# Patient Record
Sex: Female | Born: 1975 | Race: White | Hispanic: No | Marital: Married | Smoking: Never smoker
Health system: Southern US, Community
[De-identification: ages and names within clinical notes are randomized; demographics above are authoritative.]

## PROBLEM LIST (undated history)

## (undated) HISTORY — PX: NASAL CONCHA BULLOSA RESECTION: SHX2062

---

## 2010-07-23 ENCOUNTER — Ambulatory Visit (INDEPENDENT_AMBULATORY_CARE_PROVIDER_SITE_OTHER): Payer: BC Managed Care – PPO

## 2010-07-23 ENCOUNTER — Inpatient Hospital Stay (INDEPENDENT_AMBULATORY_CARE_PROVIDER_SITE_OTHER)
Admission: RE | Admit: 2010-07-23 | Discharge: 2010-07-23 | Disposition: A | Discharge status: Home / Self Care | Payer: BC Managed Care – PPO | Source: Ambulatory Visit | Attending: Family Medicine | Admitting: Family Medicine

## 2010-07-23 DIAGNOSIS — IMO0002 Reserved for concepts with insufficient information to code with codable children: Secondary | ICD-10-CM

## 2010-07-23 DIAGNOSIS — S92919A Unspecified fracture of unspecified toe(s), initial encounter for closed fracture: Secondary | ICD-10-CM

## 2011-09-23 IMAGING — CR DG TOE 5TH 2+V*L*
1 series · 1 of 1 positions shown · non-contrast
Comparison: None.

CLINICAL DATA: Pain. Stubbed fifth toe on brick wall running.
Obvious deformity.

LEFT TOE - 2+ VIEW

[view not recorded]
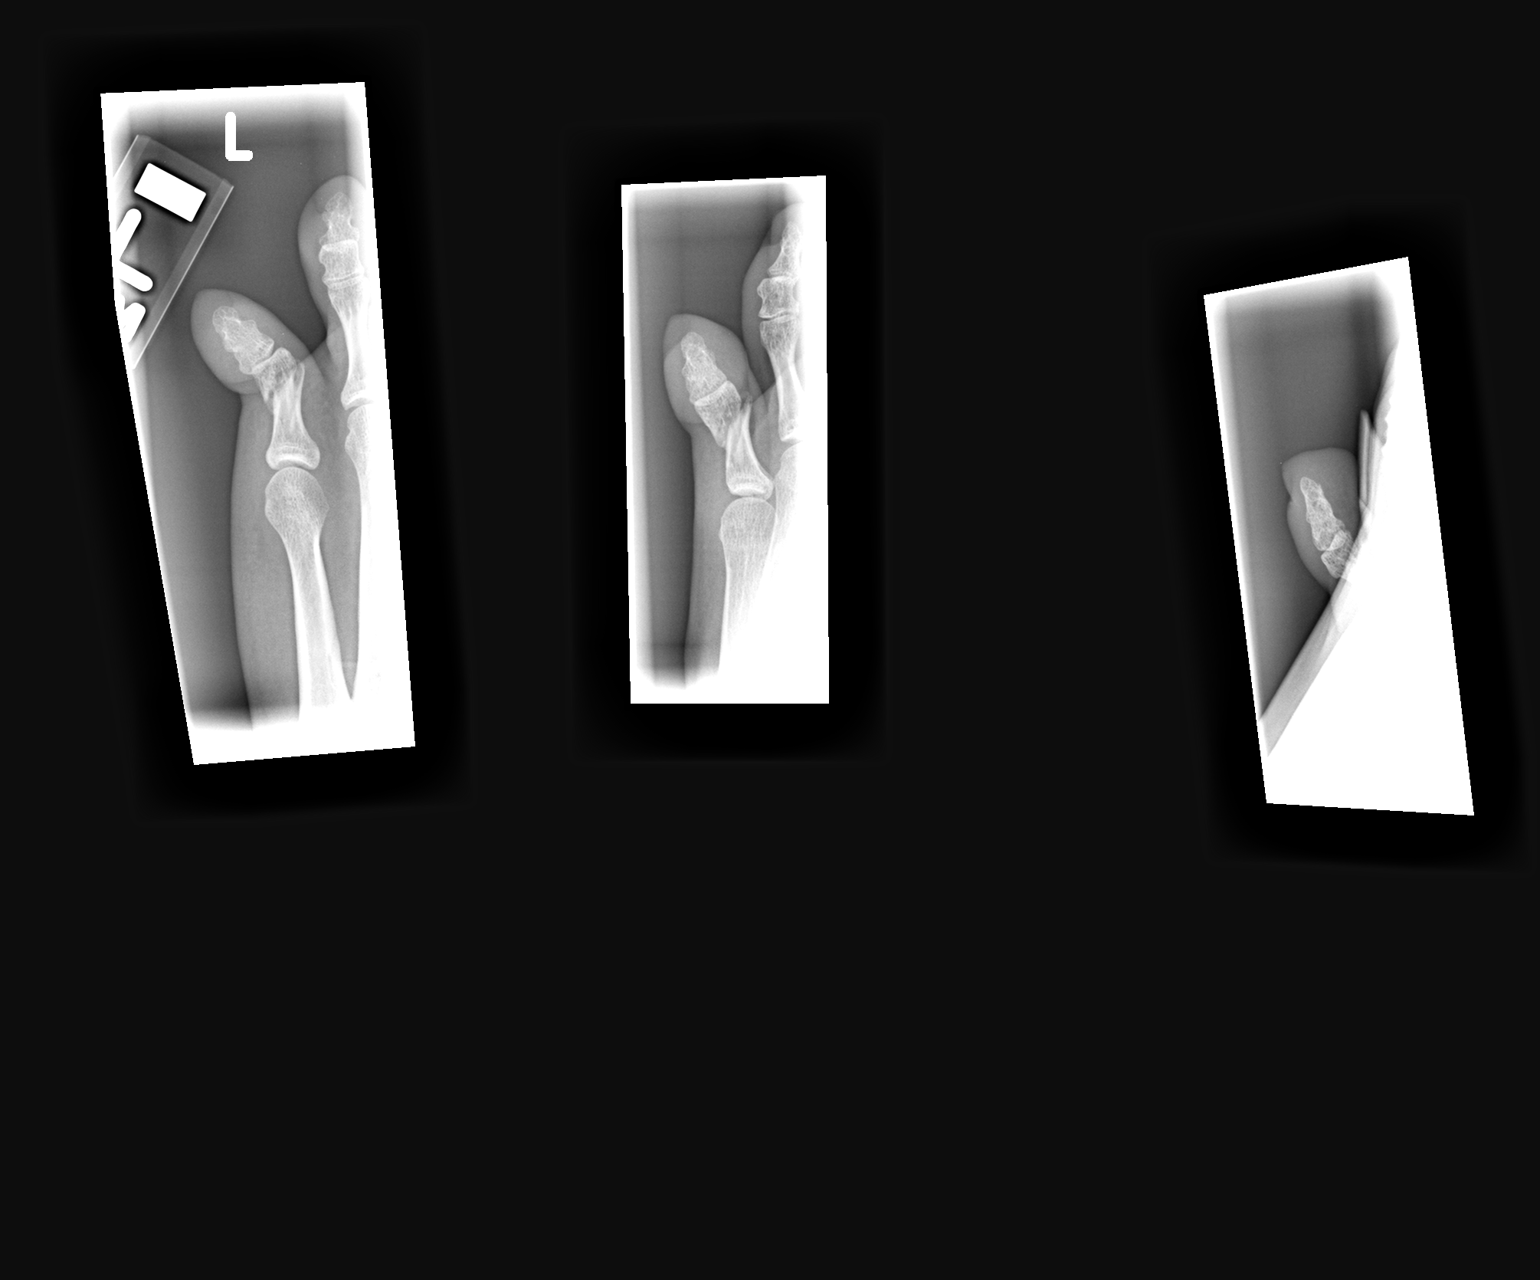

[1 of 1 positions shown; findings below may reference images not displayed]

FINDINGS: An oblique fracture is present within the proximal
phalanx of the fifth toe.  No fractures displaced at least 2.5 mm.
The joints are intact.  No radiopaque foreign body is evident.
IMPRESSION: Oblique displaced fracture of the proximal phalanx in the fifth
digit.

## 2011-09-23 IMAGING — CR DG TOE 5TH 2+V*L*
1 series · 1 of 1 positions shown · non-contrast
Comparison: Plain films of the left fifth toe [DATE]/[id number]

CLINICAL DATA: Post reduction film.

LEFT TOE - 2+ VIEW

[view not recorded]
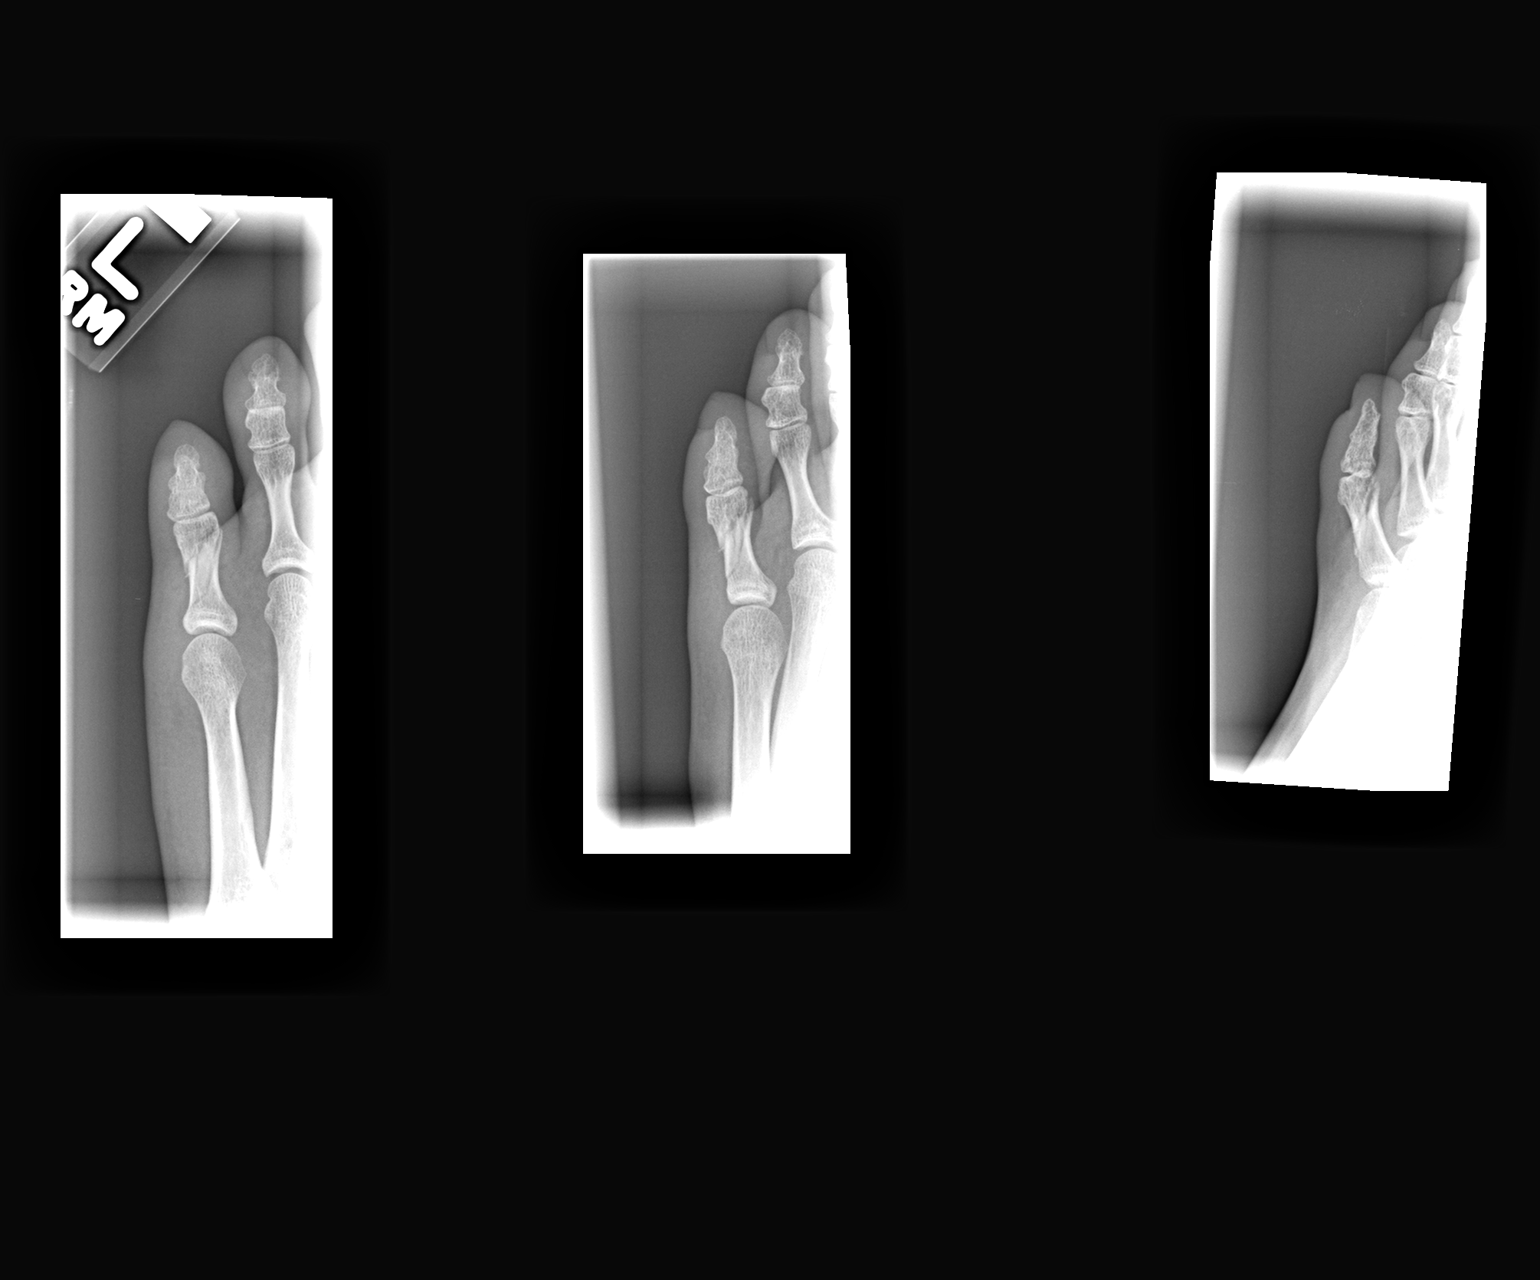

[1 of 1 positions shown; findings below may reference images not displayed]

FINDINGS: Position and alignment of the fracture of the proximal
phalanx of the little toe is now anatomic.  No new abnormality.
IMPRESSION: As above.

## 2013-07-23 ENCOUNTER — Ambulatory Visit (INDEPENDENT_AMBULATORY_CARE_PROVIDER_SITE_OTHER): Payer: BC Managed Care – PPO | Admitting: Surgery

## 2013-07-23 ENCOUNTER — Encounter (INDEPENDENT_AMBULATORY_CARE_PROVIDER_SITE_OTHER): Payer: Self-pay | Admitting: Surgery

## 2013-07-23 VITALS — BP 116/74 | HR 76 | Temp 98.2°F | Resp 16 | Ht 64.0 in | Wt 143.0 lb

## 2013-07-23 DIAGNOSIS — L723 Sebaceous cyst: Secondary | ICD-10-CM

## 2013-07-23 DIAGNOSIS — L089 Local infection of the skin and subcutaneous tissue, unspecified: Secondary | ICD-10-CM | POA: Insufficient documentation

## 2013-07-23 NOTE — Progress Notes (Signed)
Patient ID: Lisa Compton, female   DOB: June 22, 1975, 38 y.o.   MRN: [id number]  Chief Complaint  Patient presents with  . Cyst    HPI Lisa Compton is a 38 y.o. female.  Referred by Dr. Rogelia Mireick Cornella and Dr. Genia DelMarie-Lyne Lavoie for evaluation of painful right breast mass  HPI This is a healthy 38 year old female who recently had a routine annual gynecologic examination. At that time, a new mass was palpated in her right inframammary crease. [facility] was [facility]. On 06/27/13 she underwent diagnostic mammogram which showed a 1.7 cm high density mass in the right breast at 6:00. She had other benign appearing masses in both breasts. She then underwent ultrasound of both breasts. This confirmed a 1.8 cm oval mass with a circumscribed margin in the right breast at 7:00. The remainder of the masses appear to be benign cysts had one benign appearing intramammary lymph node. She then underwent ultrasound-guided biopsy on 07/05/13. Pathology confirms a benign epidermal inclusion cyst. Subsequently the area became very tender and inflamed. On 07/15/13 she underwent incision and drainage at [facility] urgent care. She was placed on doxycycline.  On 07/19/13 she underwent another ultrasound which showed a smaller oval mass in the right breast at the mammary crease. Cultures from the incision and drainage were negative. She is now referred for surgical evaluation for excision or incision.  History reviewed. No pertinent past medical history.  Past Surgical History  Procedure Laterality Date  . Nasal concha bullosa resection      History reviewed. No pertinent family history.  Social History History  Substance Use Topics  . Smoking status: Never Smoker   . Smokeless tobacco: Not on file  . Alcohol Use: No    Not on File  Current Outpatient Prescriptions  Medication Sig Dispense Refill  . doxycycline (VIBRA-TABS) 100 MG tablet       . Lactobacillus (ACIDOPHILUS) 100 MG CAPS Take by  mouth.      . Multiple Vitamin (MULTIVITAMIN) capsule Take 1 capsule by mouth daily.       No current facility-administered medications for this visit.    Review of Systems Review of Systems  Constitutional: Negative for fever, chills and unexpected weight change.  HENT: Negative for congestion, hearing loss, sore throat, trouble swallowing and voice change.   Eyes: Negative for visual disturbance.  Respiratory: Negative for cough and wheezing.   Cardiovascular: Negative for chest pain, palpitations and leg swelling.  Gastrointestinal: Negative for nausea, vomiting, abdominal pain, diarrhea, constipation, blood in stool, abdominal distention and anal bleeding.  Genitourinary: Negative for hematuria, vaginal bleeding and difficulty urinating.  Musculoskeletal: Negative for arthralgias.  Skin: Negative for rash and wound.  Neurological: Negative for seizures, syncope and headaches.  Hematological: Negative for adenopathy. Does not bruise/bleed easily.  Psychiatric/Behavioral: Negative for confusion.    Blood pressure 116/74, pulse 76, temperature 98.2 F (36.8 C), temperature source Oral, resp. rate 16, height 5\' 4"  (1.626 m), weight 143 lb (64.864 kg).  Physical Exam Physical Exam WDWN in NAD HEENT:  EOMI, sclera anicteric Neck:  No masses, no thyromegaly Breast - right inframammary crease - punctate lateral opening with some purulent drainage; medial to this is a 2 cm area of inflammation, erythema, and induration that is quite tender Lungs:  CTA bilaterally; normal respiratory effort CV:  Regular rate and rhythm; no murmurs Abd:  +bowel sounds, soft, non-tender, no masses Ext:  Well-perfused; no edema Skin:  Warm, dry; no sign of  jaundice  Data Reviewed Multiple mammograms/ ultrasounds from [facility]  Assessment    Infected sebaceous cyst - right inframammary crease with drainage through the lateral biopsy tract     Plan    Incision and drainage under local.  We prepped  the skin with Betadine and anesthetized with 1% lidocaine. I made a 1/2 cm incision across the middle of the mass with the evacuation of some purulent fluid. We packed the wound with 1/4 inch new gauze. A dry dressing was applied. She will remove the packing in 24 hours. Continue antibiotics. Followup in 3-4 days. She already has a prescription for pain medication.        Werner Labella K. 07/23/2013, 1:03 PM

## 2013-07-27 ENCOUNTER — Encounter (INDEPENDENT_AMBULATORY_CARE_PROVIDER_SITE_OTHER): Payer: Self-pay | Admitting: Surgery

## 2013-07-27 ENCOUNTER — Ambulatory Visit (INDEPENDENT_AMBULATORY_CARE_PROVIDER_SITE_OTHER): Payer: BC Managed Care – PPO | Admitting: Surgery

## 2013-07-27 DIAGNOSIS — L723 Sebaceous cyst: Secondary | ICD-10-CM

## 2013-07-27 DIAGNOSIS — L089 Local infection of the skin and subcutaneous tissue, unspecified: Secondary | ICD-10-CM

## 2013-07-27 NOTE — Progress Notes (Signed)
Status post incision and drainage of a right inframammary crease infected sebaceous cyst of 07/23/13. She still has some soreness around this area but there is much less erythema. She still has some drainage on the gauze. Mild tenderness but this is better than last time. Her wound is still open and draining small amount. There is no longer any purulent drainage coming from either the window the small pinhole. Continue with Neosporin. Followup 3 weeks.  Wilmon ArmsMatthew K. Corliss Skainssuei, MD, [facility]  General/ Trauma Surgery  07/27/2013 9:02 AM

## 2013-07-31 ENCOUNTER — Ambulatory Visit (INDEPENDENT_AMBULATORY_CARE_PROVIDER_SITE_OTHER): Payer: BC Managed Care – PPO | Admitting: Surgery

## 2013-08-06 ENCOUNTER — Encounter (INDEPENDENT_AMBULATORY_CARE_PROVIDER_SITE_OTHER): Payer: Self-pay | Admitting: Surgery

## 2013-08-06 ENCOUNTER — Ambulatory Visit (INDEPENDENT_AMBULATORY_CARE_PROVIDER_SITE_OTHER): Payer: BC Managed Care – PPO | Admitting: Surgery

## 2013-08-06 ENCOUNTER — Telehealth (INDEPENDENT_AMBULATORY_CARE_PROVIDER_SITE_OTHER): Payer: Self-pay

## 2013-08-06 VITALS — BP 122/84 | HR 75 | Resp 16 | Ht 64.0 in | Wt 142.8 lb

## 2013-08-06 DIAGNOSIS — L723 Sebaceous cyst: Secondary | ICD-10-CM

## 2013-08-06 DIAGNOSIS — L089 Local infection of the skin and subcutaneous tissue, unspecified: Secondary | ICD-10-CM

## 2013-08-06 NOTE — Patient Instructions (Signed)
Leave bandage over night.  Remove bandage tomorrow and shower.  You may get soap and water in the wound twice a day.  Wash wound twice a day.  Then repack the wound with saline gauze each time.  Vicodin for pain.  Septra DS x 1 week.

## 2013-08-06 NOTE — Progress Notes (Signed)
[facility]  Ovidio Kinavid Sumayah Bearse, MD,  FACS [address]  [address] [place], [place]    [id number] Phone:  [phone] FAX:  [phone]   Re:   Lisa Compton DOB:   07-07-1975 MRN:   [id number]  Urgent Office  ASSESSMENT AND PLAN: 1.  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  She will start dressing changes tomorrow and change the wound BID.  Neither Dr. Corliss Skainssuei and I are not in the office next week, so she will see Dr. Abbey Chattersosenbower in the U[facility] next week.  HISTORY OF PRESENT ILLNESS: Chief Complaint  Patient presents with  . eval right breast   Lisa Compton is a 38 y.o. (DOB: 07-07-1975)  white  female who is a patient of FULP, CAMMIE, MD and comes to Urgent Office today for persistent pain and drainage from two wounds at the lower outer edge of her right breast. She comes by herself.  It sounds like [facility] went [facility] early February and underwent a right breast core biopsy.  I cannot find any [facility] records in [place].   She then developed an infection in right breast at 7 o'clock.  She was seen by Dr. Corliss Skainssuei on 3/23 and 07/27/2013.  He did an I&D on 07/23/2013.  According to his last note, things were getting better.  But the patient has again developed increased pain and drainage from both the biopsy puncture site and the I&D wound site.  She comes to the Urgent Care for further eval.  No past medical history on file.  SOCIAL HISTORY: Married. 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  PHYSICAL EXAM: BP 122/84  Pulse 75  Resp 16  Ht 5\' 4"  (1.626 m)  Wt 142 lb 12.8 oz (64.774 kg)  BMI 24.50 kg/m2  General:  WN WF Right breast:  Inflamed area at right inframammary fold with two draining areas.  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  I think the only way to get this to heal is to unroof the entire  track.  I explained this to the patient.    Procedure:  I cleaned the area with Betadine.  I infiltrated 6 cc of 1% xylocaine.  I then connected the two puncture sites.  This created a 4 cm wound.  I cleaned and packed with saline gauze.  DATA REVIEWED: Epic notes  Ovidio Kinavid Graceann Boileau, MD,  [facility] [place] [address]  [address]   [place], [place]    [id number] Phone:  [phone] FAX:  [phone]

## 2013-08-06 NOTE — Telephone Encounter (Signed)
Pt called stating her incision is draining,red and painful. Pt given appt today in urg office.

## 2013-08-16 ENCOUNTER — Ambulatory Visit (INDEPENDENT_AMBULATORY_CARE_PROVIDER_SITE_OTHER): Payer: BC Managed Care – PPO | Admitting: General Surgery

## 2013-08-16 ENCOUNTER — Encounter (INDEPENDENT_AMBULATORY_CARE_PROVIDER_SITE_OTHER): Payer: Self-pay | Admitting: General Surgery

## 2013-08-16 VITALS — BP 115/75 | HR 66 | Temp 98.1°F | Resp 16 | Ht 64.0 in | Wt 144.2 lb

## 2013-08-16 DIAGNOSIS — Z4889 Encounter for other specified surgical aftercare: Secondary | ICD-10-CM

## 2013-08-16 NOTE — Patient Instructions (Signed)
Clean the wound twice a day as we discussed and apply a dry dressing. Once the wound has completely healed, you may use Mederma scar crean on the area.

## 2013-08-16 NOTE — Progress Notes (Signed)
She is here for followup after incision and drainage of infected cyst of the right breast on April 6. Wet to dry dressing changes are being done.  On exam, the lower outer quadrant wound is clean and very shallow. No surrounding erythema.  Assessment: Open wound healing in well by secondary intention.  Plan: Irrigate wound twice a day and cover with a dry dressing. Follow up with Dr. Corliss Skainssuei in 2 weeks. I did explain to her that the cyst could recur and may need excision if it did come back.

## 2013-08-17 ENCOUNTER — Telehealth (INDEPENDENT_AMBULATORY_CARE_PROVIDER_SITE_OTHER): Payer: Self-pay | Admitting: General Surgery

## 2013-08-20 ENCOUNTER — Encounter (INDEPENDENT_AMBULATORY_CARE_PROVIDER_SITE_OTHER): Payer: BC Managed Care – PPO | Admitting: Surgery

## 2013-08-22 ENCOUNTER — Encounter (INDEPENDENT_AMBULATORY_CARE_PROVIDER_SITE_OTHER): Payer: Self-pay

## 2013-08-31 ENCOUNTER — Ambulatory Visit (INDEPENDENT_AMBULATORY_CARE_PROVIDER_SITE_OTHER): Payer: BC Managed Care – PPO | Admitting: Surgery

## 2013-08-31 ENCOUNTER — Encounter (INDEPENDENT_AMBULATORY_CARE_PROVIDER_SITE_OTHER): Payer: Self-pay | Admitting: Surgery

## 2013-08-31 ENCOUNTER — Encounter (INDEPENDENT_AMBULATORY_CARE_PROVIDER_SITE_OTHER): Payer: Self-pay

## 2013-08-31 VITALS — BP 104/64 | HR 72 | Temp 98.0°F | Resp 14 | Ht 64.0 in | Wt 142.0 lb

## 2013-08-31 DIAGNOSIS — L723 Sebaceous cyst: Secondary | ICD-10-CM

## 2013-08-31 DIAGNOSIS — L089 Local infection of the skin and subcutaneous tissue, unspecified: Secondary | ICD-10-CM

## 2013-08-31 NOTE — Progress Notes (Signed)
Filed Vitals:   08/31/13 0908  BP: 104/64  Pulse: 72  Temp: 98 F (36.7 C)  Resp: 14    The patient is here for a final followup of her right inframammary crease infected sebaceous cyst. The wound is almost completely healed. There is a central area measuring about 1 cm that has a small eschar. No surrounding erythema. No drainage. She has some mild skin irritation from the dressings. I encouraged her use simply Neosporin and a small Band-Aid for a few more days until completely healed.  [place] to [place]. We will give her copies of all of her notes. I struck to her that if she starts to feel any swelling under the incision, she needs to consult with a general surgeon to consider excision of the recurrent sebaceous cyst.  Wilmon ArmsMatthew K. Corliss Skainssuei, MD, [facility]  General/ Trauma Surgery  08/31/2013 9:20 AM
